# Patient Record
Sex: Male | Born: 1960 | Race: Black or African American | Hispanic: No | Marital: Single | Smoking: Former smoker
Health system: Southern US, Community
[De-identification: ages and names within clinical notes are randomized; demographics above are authoritative.]

## PROBLEM LIST (undated history)

## (undated) DIAGNOSIS — I10 Essential (primary) hypertension: Secondary | ICD-10-CM

## (undated) DIAGNOSIS — E119 Type 2 diabetes mellitus without complications: Secondary | ICD-10-CM

## (undated) HISTORY — PX: OTHER SURGICAL HISTORY: SHX169

---

## 2016-02-04 ENCOUNTER — Observation Stay: Payer: BLUE CROSS/BLUE SHIELD | Admitting: Anesthesiology

## 2016-02-04 ENCOUNTER — Encounter
Admission: EM | Disposition: A | Discharge status: Home / Self Care | Payer: Self-pay | Source: Home / Self Care | Attending: Emergency Medicine

## 2016-02-04 ENCOUNTER — Observation Stay
Admission: EM | Admit: 2016-02-04 | Discharge: 2016-02-05 | Disposition: A | Discharge status: Home / Self Care | Payer: BLUE CROSS/BLUE SHIELD | Attending: Internal Medicine | Admitting: Internal Medicine

## 2016-02-04 ENCOUNTER — Encounter: Payer: Self-pay | Admitting: Emergency Medicine

## 2016-02-04 DIAGNOSIS — D62 Acute posthemorrhagic anemia: Secondary | ICD-10-CM | POA: Diagnosis not present

## 2016-02-04 DIAGNOSIS — K254 Chronic or unspecified gastric ulcer with hemorrhage: Secondary | ICD-10-CM | POA: Diagnosis not present

## 2016-02-04 DIAGNOSIS — I10 Essential (primary) hypertension: Secondary | ICD-10-CM | POA: Diagnosis not present

## 2016-02-04 DIAGNOSIS — F17210 Nicotine dependence, cigarettes, uncomplicated: Secondary | ICD-10-CM | POA: Insufficient documentation

## 2016-02-04 DIAGNOSIS — K921 Melena: Secondary | ICD-10-CM | POA: Diagnosis not present

## 2016-02-04 DIAGNOSIS — K922 Gastrointestinal hemorrhage, unspecified: Secondary | ICD-10-CM | POA: Diagnosis present

## 2016-02-04 DIAGNOSIS — K2960 Other gastritis without bleeding: Secondary | ICD-10-CM | POA: Diagnosis not present

## 2016-02-04 DIAGNOSIS — R42 Dizziness and giddiness: Secondary | ICD-10-CM | POA: Diagnosis present

## 2016-02-04 DIAGNOSIS — R1084 Generalized abdominal pain: Secondary | ICD-10-CM | POA: Insufficient documentation

## 2016-02-04 DIAGNOSIS — Z7982 Long term (current) use of aspirin: Secondary | ICD-10-CM | POA: Insufficient documentation

## 2016-02-04 DIAGNOSIS — T39395A Adverse effect of other nonsteroidal anti-inflammatory drugs [NSAID], initial encounter: Secondary | ICD-10-CM | POA: Diagnosis not present

## 2016-02-04 DIAGNOSIS — B9681 Helicobacter pylori [H. pylori] as the cause of diseases classified elsewhere: Secondary | ICD-10-CM | POA: Diagnosis not present

## 2016-02-04 DIAGNOSIS — E119 Type 2 diabetes mellitus without complications: Secondary | ICD-10-CM | POA: Diagnosis not present

## 2016-02-04 HISTORY — PX: ESOPHAGOGASTRODUODENOSCOPY: SHX5428

## 2016-02-04 HISTORY — DX: Type 2 diabetes mellitus without complications: E11.9

## 2016-02-04 HISTORY — DX: Essential (primary) hypertension: I10

## 2016-02-04 LAB — COMPREHENSIVE METABOLIC PANEL
ALBUMIN: 3.7 g/dL (ref 3.5–5.0)
ALK PHOS: 48 U/L (ref 38–126)
ALT: 65 U/L — ABNORMAL HIGH (ref 17–63)
AST: 46 U/L — AB (ref 15–41)
Anion gap: 6 (ref 5–15)
BILIRUBIN TOTAL: 0.2 mg/dL — AB (ref 0.3–1.2)
BUN: 28 mg/dL — AB (ref 6–20)
CALCIUM: 8.7 mg/dL — AB (ref 8.9–10.3)
CO2: 23 mmol/L (ref 22–32)
Chloride: 106 mmol/L (ref 101–111)
Creatinine, Ser: 0.81 mg/dL (ref 0.61–1.24)
GFR calc Af Amer: >60 mL/min (ref >60)
GFR calc non Af Amer: >60 mL/min (ref >60)
GLUCOSE: 103 mg/dL — AB (ref 65–99)
Potassium: 3.8 mmol/L (ref 3.5–5.1)
Sodium: 135 mmol/L (ref 135–145)
TOTAL PROTEIN: 6.7 g/dL (ref 6.5–8.1)

## 2016-02-04 LAB — CBC
HEMATOCRIT: 33 % — AB (ref 40.0–52.0)
Hemoglobin: 11.5 g/dL — ABNORMAL LOW (ref 13.0–18.0)
MCH: 31.6 pg (ref 26.0–34.0)
MCHC: 34.7 g/dL (ref 32.0–36.0)
MCV: 91 fL (ref 80.0–100.0)
Platelets: 299 10*3/uL (ref 150–440)
RBC: 3.62 MIL/uL — ABNORMAL LOW (ref 4.40–5.90)
RDW: 13.1 % (ref 11.5–14.5)
WBC: 6.5 10*3/uL (ref 3.8–10.6)

## 2016-02-04 LAB — TYPE AND SCREEN
ABO/RH(D): O POS
ANTIBODY SCREEN: NEGATIVE

## 2016-02-04 LAB — TROPONIN I: Troponin I: <0.03 ng/mL (ref <0.03)

## 2016-02-04 LAB — HEMOGLOBIN: Hemoglobin: 10.9 g/dL — ABNORMAL LOW (ref 13.0–18.0)

## 2016-02-04 SURGERY — EGD (ESOPHAGOGASTRODUODENOSCOPY)
Anesthesia: General

## 2016-02-04 MED ORDER — VITAMIN B-12 1000 MCG PO TABS: 1000.0000 ug | ORAL_TABLET | Freq: Every day | ORAL | Status: DC

## 2016-02-04 MED ORDER — SODIUM CHLORIDE 0.9 % IV SOLN
8.0000 mg/h | INTRAVENOUS | Status: DC
  Administered 2016-02-04 – 2016-02-05 (×2): 8 mg/h via INTRAVENOUS
  Filled 2016-02-04 (×3): qty 80

## 2016-02-04 MED ORDER — LIDOCAINE HCL (CARDIAC) 20 MG/ML IV SOLN
INTRAVENOUS | Status: DC | PRN
  Administered 2016-02-04: 30 mg via INTRAVENOUS

## 2016-02-04 MED ORDER — SODIUM CHLORIDE 0.9 % IV SOLN
80.0000 mg | Freq: Once | INTRAVENOUS | Status: AC
  Administered 2016-02-04: 80 mg via INTRAVENOUS
  Filled 2016-02-04: qty 80

## 2016-02-04 MED ORDER — PROPOFOL 10 MG/ML IV BOLUS
INTRAVENOUS | Status: DC | PRN
  Administered 2016-02-04: 20 mg via INTRAVENOUS
  Administered 2016-02-04: 30 mg via INTRAVENOUS

## 2016-02-04 MED ORDER — ACETAMINOPHEN 325 MG PO TABS: 650.0000 mg | ORAL_TABLET | Freq: Four times a day (QID) | ORAL | Status: DC | PRN

## 2016-02-04 MED ORDER — PROPOFOL 500 MG/50ML IV EMUL
INTRAVENOUS | Status: DC | PRN
  Administered 2016-02-04: 140 ug/kg/min via INTRAVENOUS

## 2016-02-04 MED ORDER — ACETAMINOPHEN 650 MG RE SUPP: 650.0000 mg | Freq: Four times a day (QID) | RECTAL | Status: DC | PRN

## 2016-02-04 MED ORDER — PANTOPRAZOLE SODIUM 40 MG IV SOLR: 40.0000 mg | Freq: Two times a day (BID) | INTRAVENOUS | Status: DC

## 2016-02-04 MED ORDER — SODIUM CHLORIDE 0.9 % IV SOLN
INTRAVENOUS | Status: DC
  Administered 2016-02-04: 1000 mL via INTRAVENOUS

## 2016-02-04 NOTE — Anesthesia Postprocedure Evaluation (Signed)
Anesthesia Post Note  Patient: Kenneth Carney  Procedure(s) Performed: Procedure(s) (LRB): ESOPHAGOGASTRODUODENOSCOPY (EGD) (N/A)  Patient location during evaluation: Endoscopy Anesthesia Type: General Level of consciousness: awake and alert Pain management: pain level controlled Vital Signs Assessment: post-procedure vital signs reviewed and stable Respiratory status: spontaneous breathing and respiratory function stable Cardiovascular status: stable Anesthetic complications: no    Last Vitals:  Vitals:   02/04/16 1715 02/04/16 1718  BP: 135/81   Pulse: 88   Resp: (!) 26 15  Temp: 36.4 C (!) 35.9 C    Last Pain:  Vitals:   02/04/16 1718  TempSrc: Tympanic  PainSc:                  [name] K

## 2016-02-04 NOTE — Op Note (Signed)
[facility] Gastroenterology Patient Name: Kenneth Carney Procedure Date: 02/04/2016 4:52 PM MRN: [id number] Account #: [id number] Date of Birth: 1960/12/10 Admit Type: Inpatient Age: 55 [facility] 3 Gender: Male Note Status: Finalized Procedure:            Upper GI endoscopy Indications:          Generalized abdominal pain, Melena Providers:            Ula Lingoichard Missi Mcmackin, MD Referring MD:         No Local Md, MD (Referring MD) Medicines:            Monitored Anesthesia Care Complications:        No immediate complications. Procedure:            Pre-Anesthesia Assessment:                       - Prior to the procedure, a History and Physical was                        performed, and patient medications and allergies were                        reviewed. The risks and benefits of the procedure and                        the sedation options and risks were discussed with the                        patient. All questions were answered and informed                        consent was obtained. Patient identification and                        proposed procedure were verified. Mental Status                        Examination: alert and oriented. Airway Examination:                        normal oropharyngeal airway and neck mobility.                        Respiratory Examination: clear to auscultation. CV                        Examination: regular rate and rhythm. ASA Grade                        Assessment: III - A patient with severe systemic                        disease. After reviewing the risks and benefits, the                        patient was deemed in satisfactory condition to undergo                        the procedure. The anesthesia plan was to use monitored  anesthesia care (MAC). Immediately prior to                        administration of medications, the patient was                        re-assessed for adequacy to receive  sedatives. The                        heart rate, respiratory rate, oxygen saturations, blood                        pressure, adequacy of pulmonary ventilation, and                        response to care were monitored throughout the                        procedure. The physical status of the patient was                        re-assessed after the procedure.                       After obtaining informed consent, the endoscope was                        passed under direct vision. Throughout the procedure,                        the patient's blood pressure, pulse, and oxygen                        saturations were monitored continuously. The Endoscope                        was introduced through the mouth, and advanced to the                        second part of duodenum. The upper GI endoscopy was                        accomplished without difficulty. The patient tolerated                        the procedure well. Findings:      Multiple non-bleeding erosions were found in the gastric antrum. There       were no stigmata of recent bleeding. Biopsies were taken with a cold       forceps for Helicobacter pylori testing.      Diffuse moderate inflammation was found in the gastric body. Biopsies       were taken with a cold forceps for Helicobacter pylori testing. Impression:           - Gastric erosions without bleeding. Biopsied.                       - Gastritis. Biopsied. Recommendation:       - Await pathology results.                       -  Use a proton pump inhibitor PO.                       - Return patient to hospital ward for ongoing care. Procedure Code(s):    --- Professional ---                       [phone], Esophagogastroduodenoscopy, flexible, transoral;                        with biopsy, single or multiple Diagnosis Code(s):    --- Professional ---                       K25.9, Gastric ulcer, unspecified as acute or chronic,                        without  hemorrhage or perforation                       K29.70, Gastritis, unspecified, without bleeding                       R10.84, Generalized abdominal pain                       K92.1, Melena (includes Hematochezia) CPT copyright 2016 American Medical Association. All rights reserved. The codes documented in this report are preliminary and upon coder review may  be revised to meet current compliance requirements. Ula Lingo, MD Ula Lingo, MD 02/04/2016 5:17:08 PM This report has been signed electronically. Number of Addenda: 0 Note Initiated On: 02/04/2016 4:52 PM Estimated Blood Loss: Estimated blood loss was minimal.      [facility]

## 2016-02-04 NOTE — ED Triage Notes (Signed)
C/O black stools, onset yesterday.  This morning, c/o breathing heavy when walking in the house and feeling dizzy at work today.

## 2016-02-04 NOTE — ED Provider Notes (Signed)
[facility] Emergency Department Provider Note  ____________________________________________   I have reviewed the triage vital signs and the nursing notes.   HISTORY  Chief Complaint Dizziness    HPI Kenneth Carney is a 55 y.o. male who is not on any blood thinners, he states that he has been having black stool for the last 24 hours almost. Began last night. He is at 3 or 4 tarry stools which seem large to him. He started to feel lightheaded and when he walks he has started feels a little bit short of breath. Patient has no prior history of GI bleed, he denies any fever or chills. He has no abdominal pain.      Past Medical History:  Diagnosis Date  . Diabetes mellitus without complication (HCC)   . Hypertension     There are no active problems to display for this patient.   History reviewed. No pertinent surgical history.  Prior to Admission medications   Not on File    Allergies Review of patient's allergies indicates no known allergies.  No family history on file.  Social History Social History  Substance Use Topics  . Smoking status: Former Games developermoker  . Smokeless tobacco: Never Used  . Alcohol use No    Review of Systems Constitutional: No fever/chills Eyes: No visual changes. ENT: No sore throat. No stiff neck no neck pain Cardiovascular: Denies chest pain. Respiratory: See history of present illness Gastrointestinal:   no vomiting.  No diarrhea.  No constipation. Genitourinary: Negative for dysuria. Musculoskeletal: Negative lower extremity swelling Skin: Negative for rash. Neurological: Negative for severe headaches, focal weakness or numbness. 10-point ROS otherwise negative.  ____________________________________________   PHYSICAL EXAM:  VITAL SIGNS: ED Triage Vitals  Enc Vitals Group     BP 02/04/16 0946 126/81     Pulse Rate 02/04/16 0946 89     Resp 02/04/16 0946 18     Temp 02/04/16 0946 98 F (36.7 C)     Temp  src --      SpO2 02/04/16 0946 97 %     Weight 02/04/16 0948 280 lb (127 kg)     Height 02/04/16 0948 5\' 8"  (1.727 m)     Head Circumference --      Peak Flow --      Pain Score 02/04/16 0949 2     Pain Loc --      Pain Edu? --      Excl. in GC? --     Constitutional: Alert and oriented. Well appearing and in no acute distress. Eyes: Conjunctivae are normal. PERRL. EOMI. Head: Atraumatic. Nose: No congestion/rhinnorhea. Mouth/Throat: Mucous membranes are moist.  Oropharynx non-erythematous. Neck: No stridor.   Nontender with no meningismus Cardiovascular: Normal rate, regular rhythm. Grossly normal heart sounds.  Good peripheral circulation. Respiratory: Normal respiratory effort.  No retractions. Lungs CTAB. Abdominal: Soft and nontender. No distention. No guarding no rebound Back:  There is no focal tenderness or step off.  there is no midline tenderness there are no lesions noted. there is no CVA tenderness Rectal exam: Guaiac positive black stool Musculoskeletal: No lower extremity tenderness, no upper extremity tenderness. No joint effusions, no DVT signs strong distal pulses no edema Neurologic:  Normal speech and language. No gross focal neurologic deficits are appreciated.  Skin:  Skin is warm, dry and intact. No rash noted. Psychiatric: Mood and affect are normal. Speech and behavior are normal.  ____________________________________________   LABS (all labs ordered are listed, but  only abnormal results are displayed)  Labs Reviewed  COMPREHENSIVE METABOLIC PANEL - Abnormal; Notable for the following:       Result Value   Glucose, Bld 103 (*)    BUN 28 (*)    Calcium 8.7 (*)    AST 46 (*)    ALT 65 (*)    Total Bilirubin 0.2 (*)    All other components within normal limits  CBC - Abnormal; Notable for the following:    RBC 3.62 (*)    Hemoglobin 11.5 (*)    HCT 33.0 (*)    All other components within normal limits  TROPONIN I  POC OCCULT BLOOD, ED  TYPE AND  SCREEN   ____________________________________________  EKG  I personally interpreted any EKGs ordered by me or triage Normal sinus rhythm at 88 bpm no acute ST elevation or acute ST depression ____________________________________________  RADIOLOGY  I reviewed any imaging ordered by me or triage that were performed during my shift and, if possible, patient and/or family made aware of any abnormal findings. ____________________________________________   PROCEDURES  Procedure(s) performed: None  Procedures  Critical Care performed: None  ____________________________________________   INITIAL IMPRESSION / ASSESSMENT AND PLAN / ED COURSE  Pertinent labs & imaging results that were available during my care of the patient were reviewed by me and considered in my medical decision making (see chart for details).  Patient with what appears to be an upper GI bleed. We will give him 2 IVs, BUN is elevated also suggested upper GI bleed. Hemoglobin fortunately is reassuring at this time although I suspect this will trend down given his symptomatic complaints. Patient does have a type and screen now on file. There is no evidence of other acute pathology causing his symptoms. He does not have any pain I do not think imaging is indicated. We will place 2 IVs in the patient, he will require admission for observation. I'm starting her protonic strip.  Clinical Course   ____________________________________________   FINAL CLINICAL IMPRESSION(S) / ED DIAGNOSES  Final diagnoses:  None      This chart was dictated using voice recognition software.  Despite best efforts to proofread,  errors can occur which can change meaning.     Jeanmarie PlantJames A Cara Thaxton, MD 02/04/16 [phone]

## 2016-02-04 NOTE — Consult Note (Signed)
Consultation  Referring Provider:     No ref. provider found Primary Care Physician:  No PCP Per Patient Primary Gastroenterologist:  Dr. Nedra HaiLee        Reason for Consultation:     GI bleed with a presyncopal episode.  Date of Admission:  02/04/2016 Date of Consultation:  02/04/2016         HPI:   Kenneth Carney is a 55 y.o. male with a new onset of melena and a presyncopal episode . He has a H/O NSAID usage and did note mild abdominal discomfort over recent history. There is no evidence of active bleeding at this time.  Past Medical History:  Diagnosis Date  . Diabetes mellitus without complication (HCC)   . Hypertension     Past Surgical History:  Procedure Laterality Date  . lipoma removal     neck and shoulder    Prior to Admission medications   Medication Sig Start Date End Date Taking? Authorizing Provider  Aspirin-Acetaminophen-Caffeine (GOODY HEADACHE PO) Take 1 Package by mouth daily.   Yes Historical Provider, MD  vitamin B-12 (CYANOCOBALAMIN) 1000 MCG tablet Take 1,000 mcg by mouth daily.   Yes Historical Provider, MD    Family History  Problem Relation Age of Onset  . Kidney disease Mother   . Diabetes Mother   . Hypertension Mother   . Cancer Father      Social History  Substance Use Topics  . Smoking status: Current Every Day Smoker  . Smokeless tobacco: Never Used  . Alcohol use No    Allergies as of 02/04/2016  . (No Known Allergies)    Review of Systems:    All systems reviewed and negative except where noted in HPI.   Physical Exam:  Vital signs in last 24 hours: Temp:  [98 F (36.7 C)-98.4 F (36.9 C)] 98.4 F (36.9 C) (10/30 1440) Pulse Rate:  [73-89] 73 (10/30 1440) Resp:  [0-23] 23 (10/30 1400) BP: (122-161)/(79-101) 122/79 (10/30 1440) SpO2:  [97 %-100 %] 100 % (10/30 1440) Weight:  [280 lb (127 kg)] 280 lb (127 kg) (10/30 0948) Last BM Date: 02/04/16 General:   Pleasant, cooperative in NAD Head:  Normocephalic and  atraumatic. Eyes:   No icterus.   Conjunctiva pink. PERRLA. Ears:  Normal auditory acuity. Neck:  Supple; no masses or thyroidomegaly Lungs: Respirations even and unlabored. Lungs clear to auscultation bilaterally.   No wheezes, crackles, or rhonchi.  Heart:  Regular rate and rhythm;  Without murmur, clicks, rubs or gallops Abdomen:  Soft, nondistended, nontender. Normal bowel sounds. No appreciable masses or hepatomegaly.  No rebound or guarding.  Rectal:  Not performed. Msk:  Symmetrical without gross deformities.  Strength - normal   Extremities:  Without edema, cyanosis or clubbing. Neurologic:  Alert and oriented x3;  grossly normal neurologically. Skin:  Intact without significant lesions or rashes. Cervical Nodes:  No significant cervical adenopathy. Psych:  Alert and cooperative. Normal affect.  LAB RESULTS:  Recent Labs  02/04/16 0951  WBC 6.5  HGB 11.5*  HCT 33.0*  PLT 299   BMET  Recent Labs  02/04/16 0951  NA 135  K 3.8  CL 106  CO2 23  GLUCOSE 103*  BUN 28*  CREATININE 0.81  CALCIUM 8.7*   LFT  Recent Labs  02/04/16 0951  PROT 6.7  ALBUMIN 3.7  AST 46*  ALT 65*  ALKPHOS 48  BILITOT 0.2*   PT/INR No results for input(s): LABPROT, INR in the last 72  hours.  STUDIES: No results found.    Impression / Plan:   Kenneth Carney is a 55 y.o. y/o male with a GI bleed with melena and a presyncopal episode with a NSAID usage. / Plan current supportive care - EGD for diagnosis and possible therapy.   Thank you for involving me in the care of this patient.      LOS: 0 days   Ula Lingoichard Bobetta Korf, MD  02/04/2016, 3:59 PM   Note: This dictation was prepared with Dragon dictation along with smaller phrase technology. Any transcriptional errors that result from this process are unintentional.

## 2016-02-04 NOTE — ED Notes (Signed)
Pt reports black stool since yesterday, dizziness since yesterday; Pt states he was walking around at work and began to feel like he was going to pass out. Pt alert & oriented, concerned that his car is [place] a [place]ite and needs to be moved.

## 2016-02-04 NOTE — Transfer of Care (Addendum)
Immediate Anesthesia Transfer of Care Note  Patient: Kenneth Carney  Procedure(s) Performed: Procedure(s): ESOPHAGOGASTRODUODENOSCOPY (EGD) (N/A)  Patient Location: Endoscopy Unit  Anesthesia Type:General  Level of Consciousness: awake  Airway & Oxygen Therapy: Patient Spontanous Breathing  Post-op Assessment: Post -op Vital signs reviewed and stable  Post vital signs: stable  Last Vitals:  Vitals:   02/04/16 1715 02/04/16 1718  BP:    Pulse: 88   Resp: (!) 26 15  Temp: 36.4 C (!) 35.9 C    Last Pain:  Vitals:   02/04/16 1718  TempSrc: Tympanic  PainSc:          Complications: No apparent anesthesia complications

## 2016-02-04 NOTE — Anesthesia Preprocedure Evaluation (Signed)
Anesthesia Evaluation  Patient identified by MRN, date of birth, ID band Patient awake    Reviewed: Allergy & Precautions, NPO status , Patient's Chart, lab work & pertinent test results  History of Anesthesia Complications Negative for: history of anesthetic complications  Airway Mallampati: III       Dental   Pulmonary Current Smoker,           Cardiovascular hypertension,      Neuro/Psych negative neurological ROS     GI/Hepatic negative GI ROS, Neg liver ROS,   Endo/Other  diabetes  Renal/GU negative Renal ROS     Musculoskeletal   Abdominal   Peds  Hematology  (+) anemia ,   Anesthesia Other Findings   Reproductive/Obstetrics                             Anesthesia Physical Anesthesia Plan  ASA: III and emergent  Anesthesia Plan: General   Post-op Pain Management:    Induction: Intravenous  Airway Management Planned: Nasal Cannula  Additional Equipment:   Intra-op Plan:   Post-operative Plan:   Informed Consent: I have reviewed the patients History and Physical, chart, labs and discussed the procedure including the risks, benefits and alternatives for the proposed anesthesia with the patient or authorized representative who has indicated his/her understanding and acceptance.     Plan Discussed with:   Anesthesia Plan Comments:         Anesthesia Quick Evaluation

## 2016-02-04 NOTE — Anesthesia Procedure Notes (Signed)
Date/Time: 02/04/2016 4:56 PM Performed by: Ginger CarneMICHELET, Xhaiden Coombs Pre-anesthesia Checklist: Patient identified, Emergency Drugs available, Suction available, Patient being monitored and Timeout performed Patient Re-evaluated:Patient Re-evaluated prior to inductionOxygen Delivery Method: Nasal cannula

## 2016-02-04 NOTE — H&P (Signed)
[facility] [place] [facility]   PATIENT NAME: Kenneth Carney    MR#:  [id number]  DATE OF BIRTH:  Oct 14, 1960  DATE OF ADMISSION:  02/04/2016  PRIMARY CARE PHYSICIAN: No PCP Per Patient   REQUESTING/REFERRING PHYSICIAN: Dr Ileana RoupJames McShane  CHIEF COMPLAINT:   Chief Complaint  Patient presents with  . Dizziness    HISTORY OF PRESENT ILLNESS:  Kenneth Carney  is a 55 y.o. male presents with 4 episodes of black stools. 3 last night and one this morning. 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 His stomach has been hurting he describes it as a flipping feeling. He's been very tired lately. His stomach has been growling for weeks. He has been taking BC powder over the last few weeks.  PAST MEDICAL HISTORY:   Past Medical History:  Diagnosis Date  . Diabetes mellitus without complication (HCC)   . Hypertension     PAST SURGICAL HISTORY:   Past Surgical History:  Procedure Laterality Date  . lipoma removal     neck and shoulder    SOCIAL HISTORY:   Social History  Substance Use Topics  . Smoking status: Current Every Day Smoker  . Smokeless tobacco: Never Used  . Alcohol use No    FAMILY HISTORY:   Family History  Problem Relation Age of Onset  . Kidney disease Mother   . Diabetes Mother   . Hypertension Mother   . Cancer Father     DRUG ALLERGIES:  No Known Allergies  REVIEW OF SYSTEMS:  CONSTITUTIONAL: No fever. Hot cold feeling in his stomach. Positive for weight loss. Positive for fatigue. EYES: Blurred vision with reading EARS, NOSE, AND THROAT: No tinnitus or ear pain. No sore throat. Decreased hearing. RESPIRATORY: Some cough and shortness of breath. No wheezing or hemoptysis.  CARDIOVASCULAR: Occasional chest pain. No orthopnea, edema.  GASTROINTESTINAL: No nausea, vomiting. Positive upper abdominal pain. Positive for black stools. GENITOURINARY: No dysuria,  hematuria.  ENDOCRINE: No polyuria, nocturia,  HEMATOLOGY: No anemia, easy bruising or bleeding SKIN: No rash or lesion. MUSCULOSKELETAL: Positive for arthritis.   NEUROLOGIC: No tingling, numbness, weakness.  PSYCHIATRY: No anxiety or depression.   MEDICATIONS AT HOME:   Prior to Admission medications   Medication Sig Start Date End Date Taking? Authorizing Provider  Aspirin-Acetaminophen-Caffeine (GOODY HEADACHE PO) Take 1 Package by mouth daily.   Yes Historical Provider, MD  vitamin B-12 (CYANOCOBALAMIN) 1000 MCG tablet Take 1,000 mcg by mouth daily.   Yes Historical Provider, MD      VITAL SIGNS:  Blood pressure 126/81, pulse 89, temperature 98 F (36.7 C), resp. rate 18, height 5\' 8"  (1.727 m), weight 127 kg (280 lb), SpO2 97 %.  PHYSICAL EXAMINATION:  GENERAL:  55 y.o.-year-old patient lying in the bed with no acute distress.  EYES: Pupils equal, round, reactive to light and accommodation. No scleral icterus. Extraocular muscles intact.  HEENT: Head atraumatic, normocephalic. Oropharynx and nasopharynx clear.  NECK:  Supple, no jugular venous distention. No thyroid enlargement, no tenderness.  LUNGS: Normal breath sounds bilaterally, no wheezing, rales,rhonchi or crepitation. No use of accessory muscles of respiration.  CARDIOVASCULAR: S1, S2 normal. No murmurs, rubs, or gallops.  ABDOMEN: Soft, Slight epigastric pain, nondistended. Bowel sounds present. No organomegaly or mass.  EXTREMITIES: No pedal edema, cyanosis, or clubbing.  NEUROLOGIC: Cranial nerves II through XII are intact. Muscle strength 5/5 in all extremities.  Sensation intact. Gait not checked.  PSYCHIATRIC: The patient is alert and oriented x 3.  SKIN: No rash, lesion, or ulcer.   LABORATORY PANEL:   CBC  Recent Labs Lab 02/04/16 0951  WBC 6.5  HGB 11.5*  HCT 33.0*  PLT 299    ------------------------------------------------------------------------------------------------------------------  Chemistries   Recent Labs Lab 02/04/16 0951  NA 135  K 3.8  CL 106  CO2 23  GLUCOSE 103*  BUN 28*  CREATININE 0.81  CALCIUM 8.7*  AST 46*  ALT 65*  ALKPHOS 48  BILITOT 0.2*   ------------------------------------------------------------------------------------------------------------------  Cardiac Enzymes  Recent Labs Lab 02/04/16 0951  TROPONINI <0.03   ------------------------------------------------------------------------------------------------------------------   EKG:   Normal sinus rhythm 80 bpm  IMPRESSION AND PLAN:   1. Melena and upper GI bleed secondary to [facility]. ER physician started the patient on a Protonix drip. I'm still trying to reach gastroenterology about timing for endoscopy. I advised the patient he must stop all anti-inflammatories including the BC powder. Serial hemoglobins. Transfuse only if necessary. 2. Type 2 diabetes mellitus controlled by diet 3. Elevated liver function tests rechecked tomorrow 4. Tobacco abuse smoking physician counseling done 3 minutes by me. No need for nicotine patch since he only smokes 3 cigarettes a day.   All the records are reviewed and case discussed with ED provider. Management plans discussed with the patient, family and they are in agreement.  CODE STATUS: Full Code  TOTAL TIME TAKING CARE OF THIS PATIENT: 50 minutes.    Alford HighlandWIETING, Lerline Valdivia M.D on 02/04/2016 at 12:52 PM  Between 7am to 6pm - Pager - [phone]  After 6pm call admission pager [phone]  [facility] Office  [phone]  CC: Primary care physician; No PCP Per Patient

## 2016-02-05 ENCOUNTER — Encounter: Payer: Self-pay | Admitting: Internal Medicine

## 2016-02-05 DIAGNOSIS — T39395A Adverse effect of other nonsteroidal anti-inflammatory drugs [NSAID], initial encounter: Secondary | ICD-10-CM

## 2016-02-05 DIAGNOSIS — D62 Acute posthemorrhagic anemia: Secondary | ICD-10-CM | POA: Diagnosis present

## 2016-02-05 DIAGNOSIS — K2960 Other gastritis without bleeding: Secondary | ICD-10-CM | POA: Diagnosis present

## 2016-02-05 LAB — CBC
HCT: 30 % — ABNORMAL LOW (ref 40.0–52.0)
Hemoglobin: 10.4 g/dL — ABNORMAL LOW (ref 13.0–18.0)
MCH: 31.3 pg (ref 26.0–34.0)
MCHC: 34.7 g/dL (ref 32.0–36.0)
MCV: 90.3 fL (ref 80.0–100.0)
PLATELETS: 255 10*3/uL (ref 150–440)
RBC: 3.32 MIL/uL — AB (ref 4.40–5.90)
RDW: 13.2 % (ref 11.5–14.5)
WBC: 6.1 10*3/uL (ref 3.8–10.6)

## 2016-02-05 LAB — BASIC METABOLIC PANEL
Anion gap: 4 — ABNORMAL LOW (ref 5–15)
BUN: 19 mg/dL (ref 6–20)
CALCIUM: 8.6 mg/dL — AB (ref 8.9–10.3)
CHLORIDE: 110 mmol/L (ref 101–111)
CO2: 25 mmol/L (ref 22–32)
CREATININE: 0.74 mg/dL (ref 0.61–1.24)
GFR calc non Af Amer: >60 mL/min (ref >60)
GLUCOSE: 84 mg/dL (ref 65–99)
Potassium: 3.6 mmol/L (ref 3.5–5.1)
Sodium: 139 mmol/L (ref 135–145)

## 2016-02-05 LAB — HEMOGLOBIN: HEMOGLOBIN: 10.5 g/dL — AB (ref 13.0–18.0)

## 2016-02-05 MED ORDER — PANTOPRAZOLE SODIUM 40 MG PO TBEC: 40.0000 mg | DELAYED_RELEASE_TABLET | Freq: Every day | ORAL | 0 refills | Status: DC

## 2016-02-05 MED ORDER — FERROUS SULFATE 325 (65 FE) MG PO TABS: 325.0000 mg | ORAL_TABLET | Freq: Every day | ORAL | 0 refills | Status: DC

## 2016-02-05 NOTE — Progress Notes (Signed)
[facility]         [place]   02/05/2016  Patient: Kenneth Carney Newborn   Date of Birth:  [birth year]  Date of admission:  02/04/2016  Date of Discharge  02/05/2016    To Whom it May Concern:   Kenneth Carney [place]  may return to work on 02/11/2016.  PHYSICAL ACTIVITY:  Full from 02/11/2016  If you have any questions or concerns, please don't hesitate to call.  Sincerely,   Milagros LollSudini, Shalia Bartko R M.D Office : [phone]   .

## 2016-02-05 NOTE — Discharge Instructions (Signed)
Do not use Ibuprofen, Aspirin, Naproxen, NSAIDs  Soft bland diet for 3 days and then advance  No alcohol or smoking  Activity as tolerated

## 2016-02-05 NOTE — Progress Notes (Signed)
DISCHARGE NOTE:  Pt given discharge note, prescriptions and work note. Pt verbalized understanding.

## 2016-02-06 LAB — SURGICAL PATHOLOGY

## 2016-02-07 NOTE — Discharge Summary (Signed)
[facility] [place] [facility]   PATIENT NAME: Kenneth Carney    MR#:  [id number]  DATE OF BIRTH:  1960-04-09  DATE OF ADMISSION:  02/04/2016 ADMITTING PHYSICIAN: Alford Highlandichard Wieting, MD  DATE OF DISCHARGE: 02/05/2016 12:00 PM  PRIMARY CARE PHYSICIAN: No PCP Per Patient   ADMISSION DIAGNOSIS:  Lower GI bleed [K92.2]  DISCHARGE DIAGNOSIS:  Active Problems:   Upper GI bleed   Gastritis due to nonsteroidal anti-inflammatory drug (NSAID)   Acute blood loss anemia   SECONDARY DIAGNOSIS:   Past Medical History:  Diagnosis Date  . Diabetes mellitus without complication (HCC)   . Hypertension      ADMITTING HISTORY  HISTORY OF PRESENT ILLNESS:  Kenneth Carney  is a 55 y.o. male presents with 4 episodes of black stools. 3 last night and one this morning. 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 His stomach has been hurting he describes it as a flipping feeling. He's been very tired lately. His stomach has been growling for weeks. He has been taking BC powder over the last few weeks.   HOSPITAL COURSE:   * erosive gastritis Due to [facility]. Patient presented with dizziness and melena and was found to have anemia. Acute blood loss anemia remained stable during the hospital stay. EGD showed erosive gastritis. Started on PPIs. Counseled to not take any NSAIDs or BC powders in the future. Patient has no pain. Dizziness has resolved with fluids. He is being continued on soft bland diet for 3 days and then advance as tolerated. Able for discharge home to follow-up with his primary care physician. Patient's biopsy results and H. pylori are pending. He will follow-up with GI as outpatient.  CONSULTS OBTAINED:    DRUG ALLERGIES:  No Known Allergies  DISCHARGE MEDICATIONS:   Discharge Medication List as of 02/05/2016 10:36 AM    START taking these medications   Details  ferrous sulfate 325 (65 FE) MG  tablet Take 1 tablet (325 mg total) by mouth daily with breakfast., Starting Tue 02/05/2016, Print    pantoprazole (PROTONIX) 40 MG tablet Take 1 tablet (40 mg total) by mouth daily., Starting Tue 02/05/2016, Normal      CONTINUE these medications which have NOT CHANGED   Details  vitamin B-12 (CYANOCOBALAMIN) 1000 MCG tablet Take 1,000 mcg by mouth daily., Historical Med      STOP taking these medications     Aspirin-Acetaminophen-Caffeine (GOODY HEADACHE PO)         Today   VITAL SIGNS:  Blood pressure 124/68, pulse 72, temperature 98.5 F (36.9 C), temperature source Oral, resp. rate 18, height 5\' 8"  (1.727 m), weight 127 kg (280 lb), SpO2 99 %.  I/O:  No intake or output data in the 24 hours ending 02/07/16 1625  PHYSICAL EXAMINATION:  Physical Exam  GENERAL:  55 y.o.-year-old patient lying in the bed with no acute distress.  LUNGS: Normal breath sounds bilaterally, no wheezing, rales,rhonchi or crepitation. No use of accessory muscles of respiration.  CARDIOVASCULAR: S1, S2 normal. No murmurs, rubs, or gallops.  ABDOMEN: Soft, non-tender, non-distended. Bowel sounds present. No organomegaly or mass.  NEUROLOGIC: Moves all 4 extremities. PSYCHIATRIC: The patient is alert and oriented x 3.  SKIN: No obvious rash, lesion, or ulcer.   DATA REVIEW:   CBC  Recent Labs Lab 02/05/16 0035  WBC 6.1  HGB 10.4*  10.5*  HCT 30.0*  PLT 255    Chemistries   Recent Labs Lab 02/04/16 0951 02/05/16 0035  NA 135 139  K 3.8 3.6  CL 106 110  CO2 23 25  GLUCOSE 103* 84  BUN 28* 19  CREATININE 0.81 0.74  CALCIUM 8.7* 8.6*  AST 46*  --   ALT 65*  --   ALKPHOS 48  --   BILITOT 0.2*  --     Cardiac Enzymes  Recent Labs Lab 02/04/16 0951  TROPONINI <0.03    Microbiology Results  No results found for this or any previous visit.  RADIOLOGY:  No results found.  Follow up with PCP in 1 week.  Management plans discussed with the patient, family and they are  in agreement.  CODE STATUS:  Code Status History    Date Active Date Inactive Code Status Order ID Comments User Context   02/04/2016  1:01 PM 02/05/2016  5:38 PM Full Code [id number]  Alford Highlandichard Wieting, MD ED      TOTAL TIME TAKING CARE OF THIS PATIENT ON DAY OF DISCHARGE: more than 30 minutes.   Milagros LollSudini, Grey Schlauch R M.D on 02/07/2016 at 4:25 PM  Between 7am to 6pm - Pager - [phone]  After 6pm go to [email or web address] - password [email or web address] [facility]  [facility]  Office  [phone]  CC: Primary care physician; No PCP Per Patient  Note: This dictation was prepared with Dragon dictation along with smaller phrase technology. Any transcriptional errors that result from this process are unintentional.

## 2017-03-03 ENCOUNTER — Ambulatory Visit
Admission: RE | Admit: 2017-03-03 | Discharge: 2017-03-03 | Disposition: A | Discharge status: Home / Self Care | Payer: Worker's Compensation | Source: Ambulatory Visit | Attending: Physician Assistant | Admitting: Physician Assistant

## 2017-03-03 ENCOUNTER — Other Ambulatory Visit: Payer: Self-pay | Admitting: Physician Assistant

## 2017-03-03 DIAGNOSIS — M79644 Pain in right finger(s): Secondary | ICD-10-CM

## 2017-03-03 DIAGNOSIS — S62666A Nondisplaced fracture of distal phalanx of right little finger, initial encounter for closed fracture: Secondary | ICD-10-CM | POA: Insufficient documentation

## 2017-03-03 DIAGNOSIS — X58XXXA Exposure to other specified factors, initial encounter: Secondary | ICD-10-CM | POA: Diagnosis not present

## 2018-07-21 ENCOUNTER — Other Ambulatory Visit: Payer: Self-pay

## 2018-07-21 ENCOUNTER — Ambulatory Visit
Admission: EM | Admit: 2018-07-21 | Discharge: 2018-07-21 | Disposition: A | Discharge status: Home / Self Care | Payer: BLUE CROSS/BLUE SHIELD | Attending: Family Medicine | Admitting: Family Medicine

## 2018-07-21 ENCOUNTER — Encounter: Payer: Self-pay | Admitting: Emergency Medicine

## 2018-07-21 DIAGNOSIS — M545 Low back pain, unspecified: Secondary | ICD-10-CM

## 2018-07-21 DIAGNOSIS — R1032 Left lower quadrant pain: Secondary | ICD-10-CM | POA: Diagnosis not present

## 2018-07-21 LAB — COMPREHENSIVE METABOLIC PANEL
ALT: 52 U/L — ABNORMAL HIGH (ref 0–44)
AST: 38 U/L (ref 15–41)
Albumin: 4.4 g/dL (ref 3.5–5.0)
Alkaline Phosphatase: 65 U/L (ref 38–126)
Anion gap: 6 (ref 5–15)
BUN: 15 mg/dL (ref 6–20)
CO2: 25 mmol/L (ref 22–32)
Calcium: 9.1 mg/dL (ref 8.9–10.3)
Chloride: 105 mmol/L (ref 98–111)
Creatinine, Ser: 0.77 mg/dL (ref 0.61–1.24)
GFR calc Af Amer: >60 mL/min (ref >60)
GFR calc non Af Amer: >60 mL/min (ref >60)
Glucose, Bld: 100 mg/dL — ABNORMAL HIGH (ref 70–99)
Potassium: 3.7 mmol/L (ref 3.5–5.1)
Sodium: 136 mmol/L (ref 135–145)
Total Bilirubin: 1.1 mg/dL (ref 0.3–1.2)
Total Protein: 8.3 g/dL — ABNORMAL HIGH (ref 6.5–8.1)

## 2018-07-21 LAB — URINALYSIS, COMPLETE (UACMP) WITH MICROSCOPIC
Bacteria, UA: NONE SEEN
Bilirubin Urine: NEGATIVE
Glucose, UA: NEGATIVE mg/dL
Hgb urine dipstick: NEGATIVE
Ketones, ur: NEGATIVE mg/dL
Leukocytes,Ua: NEGATIVE
Nitrite: NEGATIVE
Specific Gravity, Urine: 1.025 (ref 1.005–1.030)
WBC, UA: NONE SEEN WBC/hpf (ref 0–5)
pH: 5 (ref 5.0–8.0)

## 2018-07-21 LAB — HEMOGLOBIN A1C
Hgb A1c MFr Bld: 5.5 % (ref 4.8–5.6)
Mean Plasma Glucose: 111.15 mg/dL

## 2018-07-21 MED ORDER — TAMSULOSIN HCL 0.4 MG PO CAPS: 0.4000 mg | ORAL_CAPSULE | Freq: Every day | ORAL | 0 refills | Status: DC

## 2018-07-21 MED ORDER — HYDROCODONE-ACETAMINOPHEN 5-325 MG PO TABS: 1.0000 | ORAL_TABLET | Freq: Three times a day (TID) | ORAL | 0 refills | Status: DC | PRN

## 2018-07-21 MED ORDER — TIZANIDINE HCL 4 MG PO TABS: 4.0000 mg | ORAL_TABLET | Freq: Three times a day (TID) | ORAL | 0 refills | Status: DC | PRN

## 2018-07-21 NOTE — ED Provider Notes (Signed)
[facility]    CSN: [id number] Arrival date & time: 07/21/18  4098  History   Chief Complaint Chief Complaint  Patient presents with  . Back Pain  . Groin Pain   HPI  58 year old male presents with the above complaints.  Patient reports he developed left low back pain on Sunday.  Has steadily worsened.  States that his pain is currently 10/10 in severity.  He reports left low back pain with radiation to his groin and left testicle.  Patient states that he is unable to get comfortable.  Describes the pain as sharp.  He has been using ice and heat without resolution.  Patient states that his pain seems to be worse with movement.  No recent fall, trauma, injury.  No reports of hematuria.  No nausea, vomiting, diarrhea.  No known relieving factors.  No other associated symptoms.  No other complaints.  PMH, Surgical Hx, Family Hx, Social History reviewed and updated as below.  PMH: Hypertension, DM 2, history of GI bleed secondary to NSAID use, hepatitis C  Past Surgical History:  Procedure Laterality Date  . ESOPHAGOGASTRODUODENOSCOPY N/A 02/04/2016   Procedure: ESOPHAGOGASTRODUODENOSCOPY (EGD);  Surgeon: Ula Lingo, MD;  Location: [facility] ENDOSCOPY;  Service: Gastroenterology;  Laterality: N/A;  . lipoma removal     neck and shoulder    Home Medications    Prior to Admission medications   Medication Sig Start Date End Date Taking? Authorizing Provider  HYDROcodone-acetaminophen (NORCO/VICODIN) 5-325 MG tablet Take 1 tablet by mouth every 8 (eight) hours as needed for moderate pain or severe pain. 07/21/18   Tommie Sams, DO  tamsulosin (FLOMAX) 0.4 MG CAPS capsule Take 1 capsule (0.4 mg total) by mouth daily. 07/21/18   Tommie Sams, DO  tiZANidine (ZANAFLEX) 4 MG tablet Take 1 tablet (4 mg total) by mouth every 8 (eight) hours as needed for muscle spasms. 07/21/18   Tommie Sams, DO    Family History Family History  Problem Relation Age of Onset  . Kidney disease  Mother   . Diabetes Mother   . Hypertension Mother   . Cancer Father     Social History Social History   Tobacco Use  . Smoking status: Former Games developer  . Smokeless tobacco: Never Used  Substance Use Topics  . Alcohol use: No  . Drug use: No     Allergies   Patient has no known allergies.   Review of Systems Review of Systems  Gastrointestinal: Negative.   Genitourinary: Negative.        Groin pain, L testicle pain.  Musculoskeletal: Positive for back pain.   Physical Exam Triage Vital Signs ED Triage Vitals  Enc Vitals Group     BP 07/21/18 0838 (!) 148/86     Pulse Rate 07/21/18 0838 76     Resp 07/21/18 0838 18     Temp 07/21/18 0838 98.1 F (36.7 C)     Temp Source 07/21/18 0838 Oral     SpO2 07/21/18 0838 97 %     Weight 07/21/18 0836 280 lb (127 kg)     Height 07/21/18 0836  (1.727 m)     Head Circumference --      Peak Flow --      Pain Score 07/21/18 0835 10     Pain Loc --      Pain Edu? --      Excl. in GC? --    Updated Vital Signs BP (!) 148/86 (BP Location:  Right Arm)   Pulse 76   Temp 98.1 F (36.7 C) (Oral)   Resp 18   Ht 5\' 8"  (1.727 m)   Wt 127 kg   SpO2 97%   BMI 42.57 kg/m   Visual Acuity Right Eye Distance:   Left Eye Distance:   Bilateral Distance:    Right Eye Near:   Left Eye Near:    Bilateral Near:     Physical Exam Vitals signs and nursing note reviewed.  Constitutional:      Appearance: He is obese.     Comments: Appears uncomfortable.  HENT:     Head: Normocephalic and atraumatic.  Eyes:     General:        Right eye: No discharge.     Conjunctiva/sclera: Conjunctivae normal.  Cardiovascular:     Rate and Rhythm: Normal rate and regular rhythm.  Pulmonary:     Effort: Pulmonary effort is normal.     Breath sounds: Normal breath sounds.  Abdominal:     General: There is no distension.     Palpations: Abdomen is soft.     Comments: Mild LLQ tenderness to palpation.  Genitourinary:    Penis: Normal.       Scrotum/Testes: Normal.  Musculoskeletal:     Comments: Left lumbar spine mild tenderness to palpation.  Patient had considerable difficulty laying down on the exam table.  Neurological:     Mental Status: He is alert.  Psychiatric:        Mood and Affect: Mood normal.        Behavior: Behavior normal.    UC Treatments / Results  Labs (all labs ordered are listed, but only abnormal results are displayed) Labs Reviewed  URINALYSIS, COMPLETE (UACMP) WITH MICROSCOPIC - Abnormal; Notable for the following components:      Result Value   Protein, ur TRACE (*)    All other components within normal limits  COMPREHENSIVE METABOLIC PANEL  HEMOGLOBIN A1C   EKG None  Radiology No results found.  Procedures Procedures (including critical care time)  Medications Ordered in UC Medications - No data to display  Initial Impression / Assessment and Plan / UC Course  I have reviewed the triage vital signs and the nursing notes.  Pertinent labs & imaging results that were available during my care of the patient were reviewed by me and considered in my medical decision making (see chart for details).    58 year old male presents with back pain radiating to the groin and left testicle.  Concern for possible nephrolithiasis.  Urinalysis was unremarkable.  CT scan not available today.  Treating for both musculoskeletal etiology as well as possible kidney stone.  Treating with Vicodin, Flomax, Zanaflex.  Patient also desired to have his blood drawn routine labs and diabetes.  I obliged.  Awaiting CMP and A1c.  [place] controlled substance [place] reviewed today.  No concerns for abuse.  Final Clinical Impressions(s) / UC Diagnoses   Final diagnoses:  Acute left-sided low back pain without sciatica  Left inguinal pain     Discharge Instructions     Medications as directed for your pain.  If this persists, you will need a CT (I don't have one available at this time).   Strain your urine.  Take care  Dr. Adriana Simasook     ED Prescriptions    Medication Sig Dispense Auth. Provider   HYDROcodone-acetaminophen (NORCO/VICODIN) 5-325 MG tablet Take 1 tablet by mouth every 8 (eight) hours as needed for  moderate pain or severe pain. 10 tablet Casten Floren G, DO   tamsulosin (FLOMAX) 0.4 MG CAPS capsule Take 1 capsule (0.4 mg total) by mouth daily. 14 capsule Ramie Palladino G, DO   tiZANidine (ZANAFLEX) 4 MG tablet Take 1 tablet (4 mg total) by mouth every 8 (eight) hours as needed for muscle spasms. 30 tablet Tommie Sams, DO     Controlled Substance Prescriptions [place] Controlled Substance Registry consulted? Yes, I have consulted the [place] Controlled Substances Registry for this patient, and feel the risk/benefit ratio today is favorable for proceeding with this prescription for a controlled substance.   Everlene Other [place], [place] 07/21/18 [phone]

## 2018-07-21 NOTE — ED Triage Notes (Signed)
Patient c/o left side low back pain and left groin pain that started Sunday. Patient reports the pain has increased. He has been using heat and ice to his back with no relief.

## 2018-07-21 NOTE — Discharge Instructions (Signed)
Medications as directed for your pain.  If this persists, you will need a CT (I don't have one available at this time).  Strain your urine.  Take care  Dr. Adriana Simas

## 2018-07-23 ENCOUNTER — Telehealth (HOSPITAL_COMMUNITY): Payer: Self-pay | Admitting: Emergency Medicine

## 2018-07-23 NOTE — Telephone Encounter (Signed)
Patient contacted and made aware of all results, all questions answered.   

## 2018-10-29 ENCOUNTER — Ambulatory Visit (INDEPENDENT_AMBULATORY_CARE_PROVIDER_SITE_OTHER): Payer: BC Managed Care – PPO

## 2018-10-29 ENCOUNTER — Encounter: Payer: Self-pay | Admitting: Emergency Medicine

## 2018-10-29 ENCOUNTER — Ambulatory Visit
Admission: EM | Admit: 2018-10-29 | Discharge: 2018-10-29 | Disposition: A | Discharge status: Home / Self Care | Payer: BC Managed Care – PPO | Attending: Family Medicine | Admitting: Family Medicine

## 2018-10-29 ENCOUNTER — Other Ambulatory Visit: Payer: Self-pay

## 2018-10-29 DIAGNOSIS — R030 Elevated blood-pressure reading, without diagnosis of hypertension: Secondary | ICD-10-CM | POA: Diagnosis not present

## 2018-10-29 DIAGNOSIS — M25571 Pain in right ankle and joints of right foot: Secondary | ICD-10-CM

## 2018-10-29 DIAGNOSIS — W19XXXA Unspecified fall, initial encounter: Secondary | ICD-10-CM

## 2018-10-29 NOTE — ED Provider Notes (Signed)
[facility] ____________________________________________  Time seen: Approximately 12:00 PM  I have reviewed the triage vital signs and the nursing notes.   HISTORY  Chief Complaint Ankle Pain (right)  HPI Kenneth Carney is a 58 y.o. male presenting for evaluation of right medial ankle pain post injury that occurred yesterday afternoon.  Patient reports he tripped and twisted his ankle and fell.  States only injured right ankle.  Denies other pain or injuries.  States ankle pain has continued.  Able to weight-bear but does hurt to do so.  Denies alleviating measures.  No pain radiation or paresthesias.  States pain currently mild to moderate.  Reports otherwise doing well.  Denies recent cough, congestion, fevers, chest pain, shortness of breath.  Does have a history of hypertension, states no longer on medication.  Blood pressure was elevated in urgent care, recommend for patient to keep a journal, monitor and follow-up with primary care.   Past Medical History:  Diagnosis Date  . Diabetes mellitus without complication (HCC)   . Hypertension     Patient Active Problem List   Diagnosis Date Noted  . Gastritis due to nonsteroidal anti-inflammatory drug (NSAID) 02/05/2016  . Acute blood loss anemia 02/05/2016  . Upper GI bleed 02/04/2016    Past Surgical History:  Procedure Laterality Date  . ESOPHAGOGASTRODUODENOSCOPY N/A 02/04/2016   Procedure: ESOPHAGOGASTRODUODENOSCOPY (EGD);  Surgeon: Ula Lingoichard Lee, MD;  [facility];  Service: Gastroenterology;  Laterality: N/A;  . lipoma removal     neck and shoulder     No current facility-administered medications for this encounter.  No current outpatient medications on file.  Allergies Patient has no known allergies.  Family History  Problem Relation Age of Onset  . Kidney disease Mother   . Diabetes Mother   . Hypertension Mother   . Cancer Father     Social History Social History   Tobacco Use  .  Smoking status: Former Games developermoker  . Smokeless tobacco: Never Used  Substance Use Topics  . Alcohol use: No  . Drug use: No    Review of Systems Constitutional: No fever Eyes: No visual changes. ENT: No sore throat. Cardiovascular: Denies chest pain. Respiratory: Denies shortness of breath. Gastrointestinal: No abdominal pain.   Musculoskeletal: Positive right ankle pain. Skin: Negative for rash. Neurological: Negative for headaches, focal weakness or numbness.  ____________________________________________   PHYSICAL EXAM:  VITAL SIGNS: ED Triage Vitals  Enc Vitals Group     BP 10/29/18 1019 (!) 160/105     Pulse Rate 10/29/18 1019 67     Resp 10/29/18 1019 16     Temp 10/29/18 1019 98 F (36.7 C)     Temp Source 10/29/18 1019 Oral     SpO2 10/29/18 1019 99 %     Weight 10/29/18 1015 285 lb (129.3 kg)     Height 10/29/18 1015 5\' 8"  (1.727 m)     Head Circumference --      Peak Flow --      Pain Score 10/29/18 1015 9     Pain Loc --      Pain Edu? --      Excl. in GC? --     Constitutional: Alert and oriented. Well appearing and in no acute distress. Eyes: Conjunctivae are normal.  ENT      Head: Normocephalic and atraumatic. Cardiovascular: Normal rate, regular rhythm. Grossly normal heart sounds.  Good peripheral circulation. Respiratory: Normal respiratory effort without tachypnea nor retractions. Breath sounds are clear and  equal bilaterally. No wheezes, rales, rhonchi. Musculoskeletal:  Bilateral pedal pulses equal and easily palpated. Except: Right medial malleolus mild to moderate tenderness to direct palpation with mild localized swelling, no lateral tenderness, right lower extremity otherwise nontender, able to fully plantarflex and dorsiflex, pain in room mildly limited ankle rotation. Neurologic:  Normal speech and language. Skin:  Skin is warm, dry and intact. No rash noted. Psychiatric: Mood and affect are normal. Speech and behavior are normal. Patient  exhibits appropriate insight and judgment   ___________________________________________   LABS (all labs ordered are listed, but only abnormal results are displayed)  Labs Reviewed - No data to display ____________________________________________   RADIOLOGY  Dg Ankle Complete Right  Result Date: 10/29/2018 CLINICAL DATA:  Fall, right ankle pain EXAM: RIGHT ANKLE - COMPLETE 3+ VIEW COMPARISON:  None. FINDINGS: No acute bony abnormality. Specifically, no fracture, subluxation, or dislocation. Plantar and posterior calcaneal spurs. Soft tissues are intact with mild medial soft tissue swelling. IMPRESSION: No acute bony abnormality. Electronically Signed   By: Rolm Baptise M.D.   On: 10/29/2018 10:54   ____________________________________________   PROCEDURES Procedures    INITIAL IMPRESSION / ASSESSMENT AND PLAN / ED COURSE  Pertinent labs & imaging results that were available during my care of the patient were reviewed by me and considered in my medical decision making (see chart for details).  Well-appearing patient.  No acute distress.  Right ankle pain post mechanical injury.  Right x-ray as above, no acute bony abnormality.  Suspect sprain injury.  Velcro ASO splint given for support.  Directed to ice, elevate and gradual increase activity as tolerated.  Work note given for today and tomorrow.  Also encouraged patient to monitor blood pressure at home, keep a journal and follow-up with primary care.  Discussed follow up with Primary care physician this week. Discussed follow up and return parameters including no resolution or any worsening concerns. Patient verbalized understanding and agreed to plan.   ____________________________________________   FINAL CLINICAL IMPRESSION(S) / ED DIAGNOSES  Final diagnoses:  Acute right ankle pain  Elevated blood pressure reading     ED Discharge Orders    None       Note: This dictation was prepared with Dragon dictation along  with smaller phrase technology. Any transcriptional errors that result from this process are unintentional.         Marylene Land, NP 10/29/18 1204

## 2018-10-29 NOTE — Discharge Instructions (Addendum)
Rest. Ice. Gradually increase activity as tolerated. Monitor your blood pressure and follow up with your primary doctor.  Follow up with your primary care physician or the above, this week as needed. Return to Urgent care for new or worsening concerns.

## 2018-10-29 NOTE — ED Triage Notes (Signed)
Patient states that he fell and twisted his right ankle yesterday.  Patient c/o right ankle pain.

## 2018-11-15 IMAGING — CR DG FINGER LITTLE 2+V*R*
3 series · 3 of 3 positions shown · non-contrast
Comparison: None.

CLINICAL DATA: Acute right little finger pain following crush
injury today. Initial encounter.

EXAM:
RIGHT LITTLE FINGER 2+V

[finger ap]
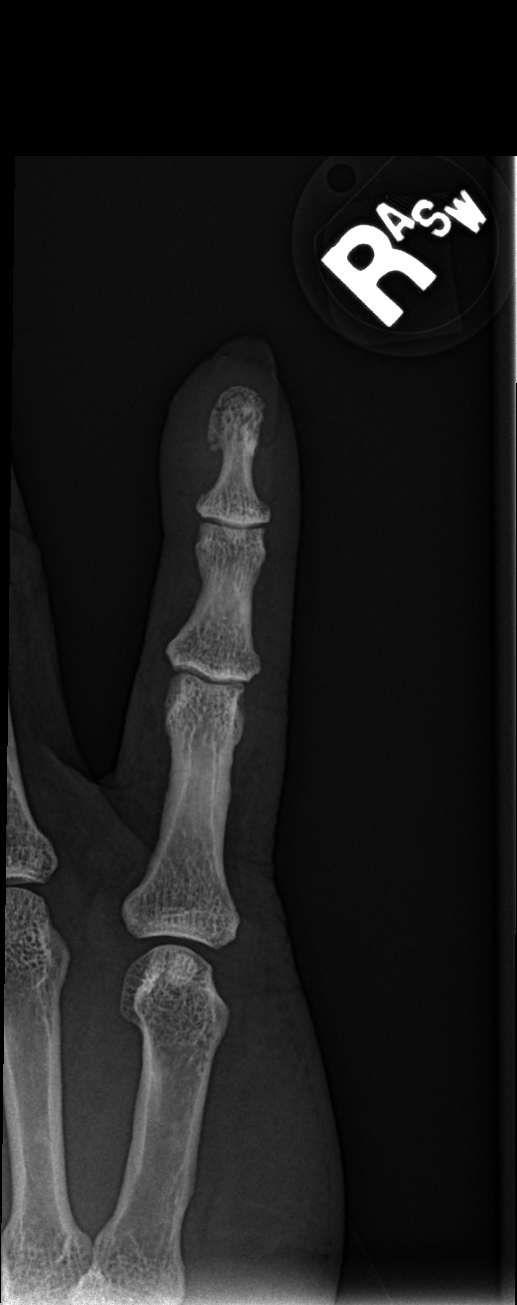

[finger obl]
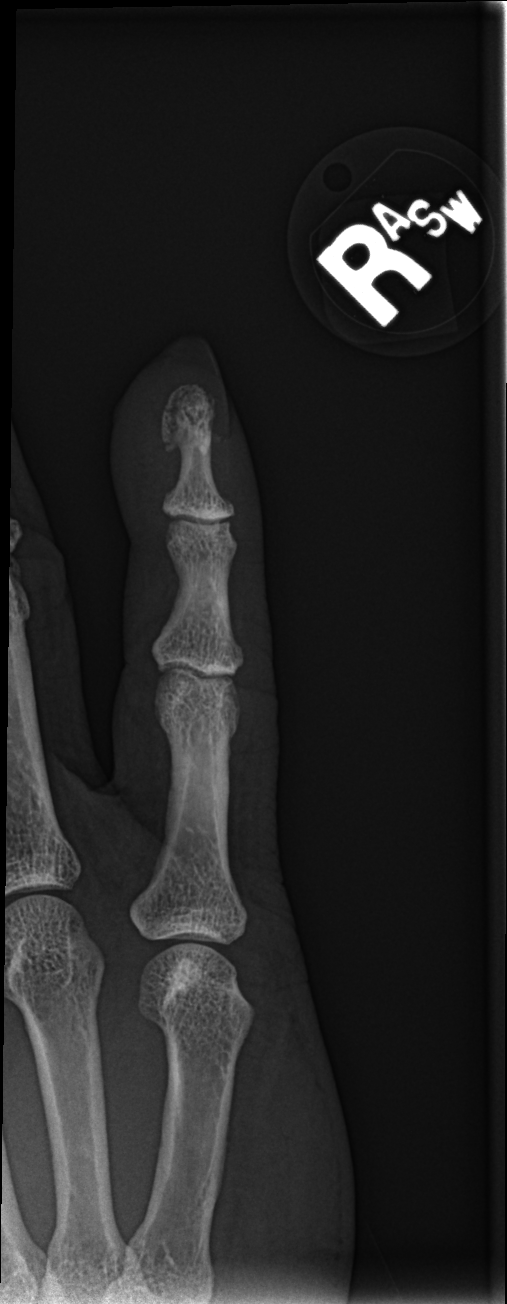

[finger lat]
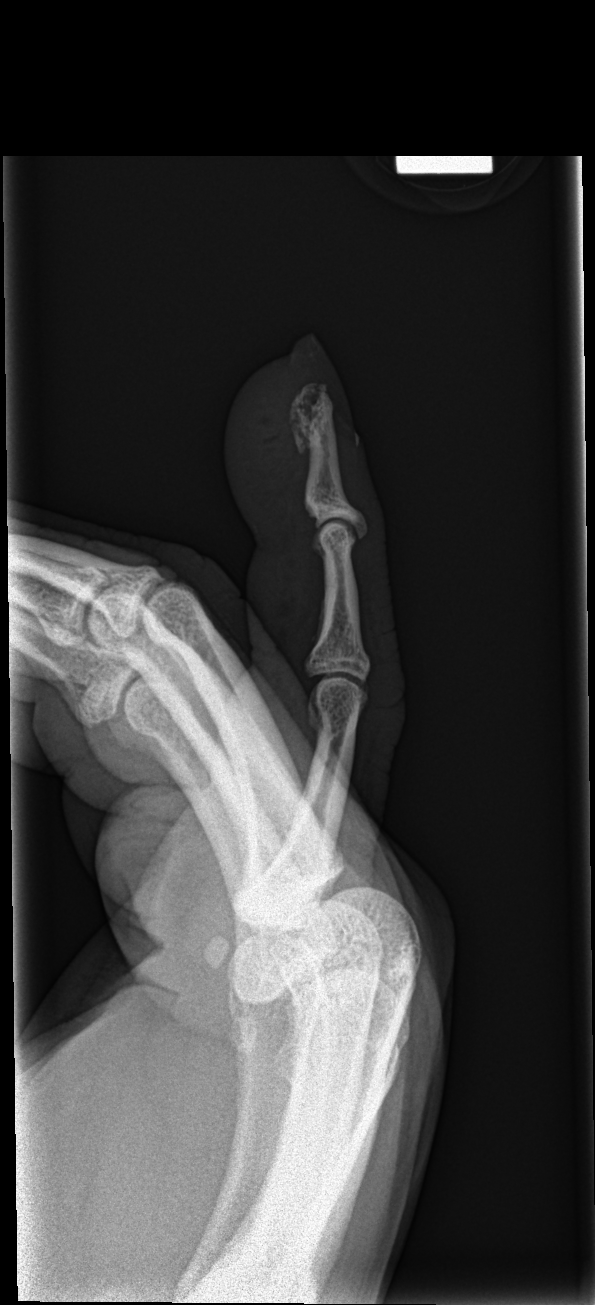

[3 of 3 positions shown; findings below may reference images not displayed]

FINDINGS: A nondisplaced mildly comminuted fracture of the little finger tuft
is identified. Associated soft tissue swelling is present.

There is no evidence of subluxation or dislocation.

Mild degenerative changes at the DIP joint noted.
IMPRESSION: Nondisplaced mildly comminuted tuft fracture.

## 2020-07-12 IMAGING — CR RIGHT ANKLE - COMPLETE 3+ VIEW
3 series · 3 of 3 positions shown · non-contrast
Comparison: None.

CLINICAL DATA: Fall, right ankle pain

EXAM:
RIGHT ANKLE - COMPLETE 3+ VIEW

[ankle ap]
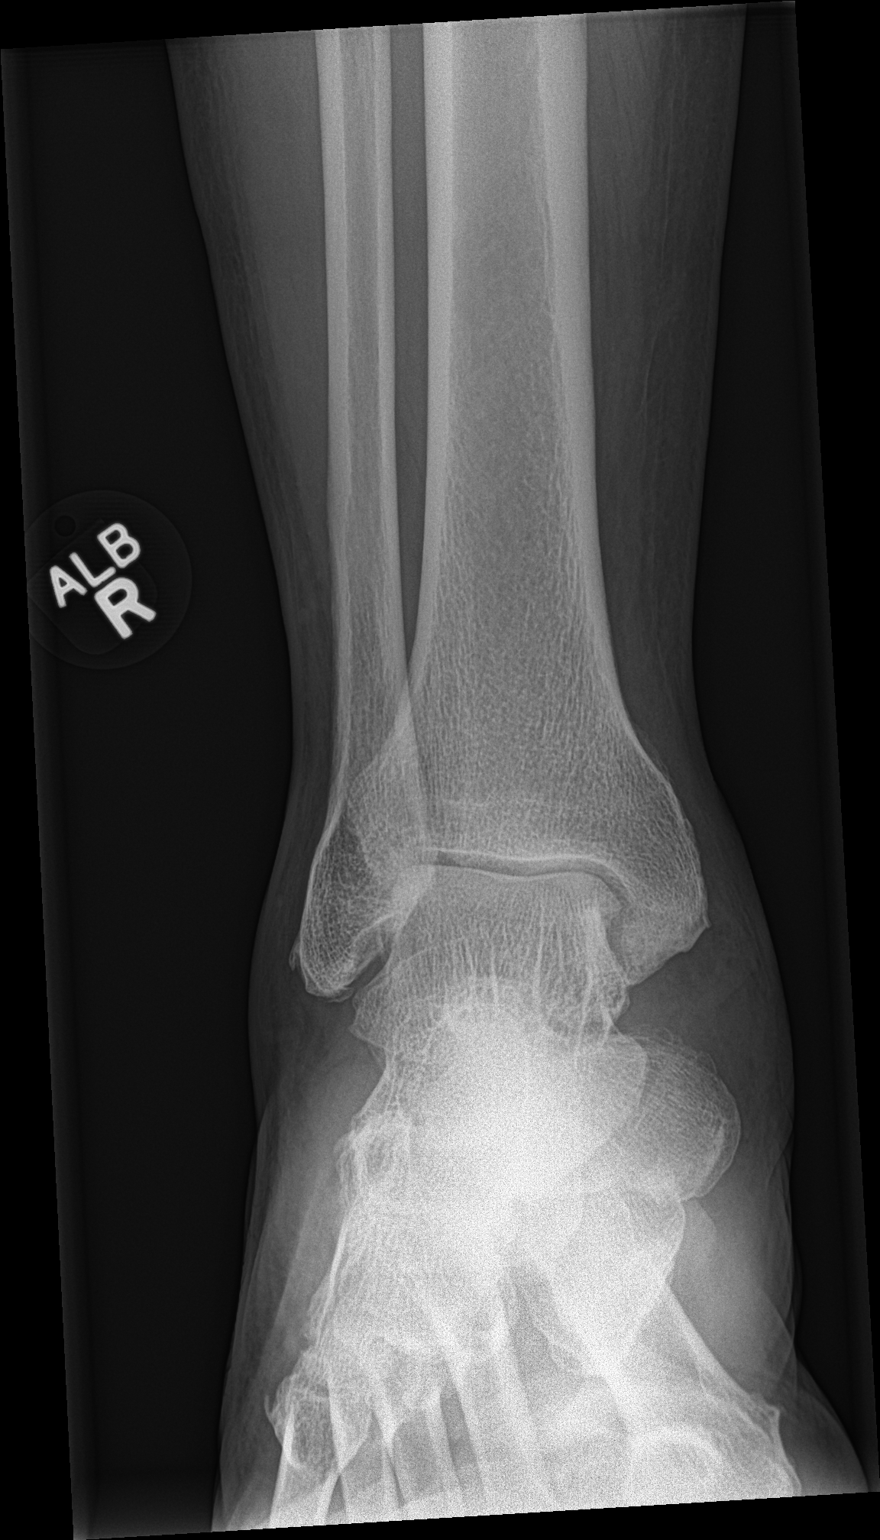

[ankle obl]
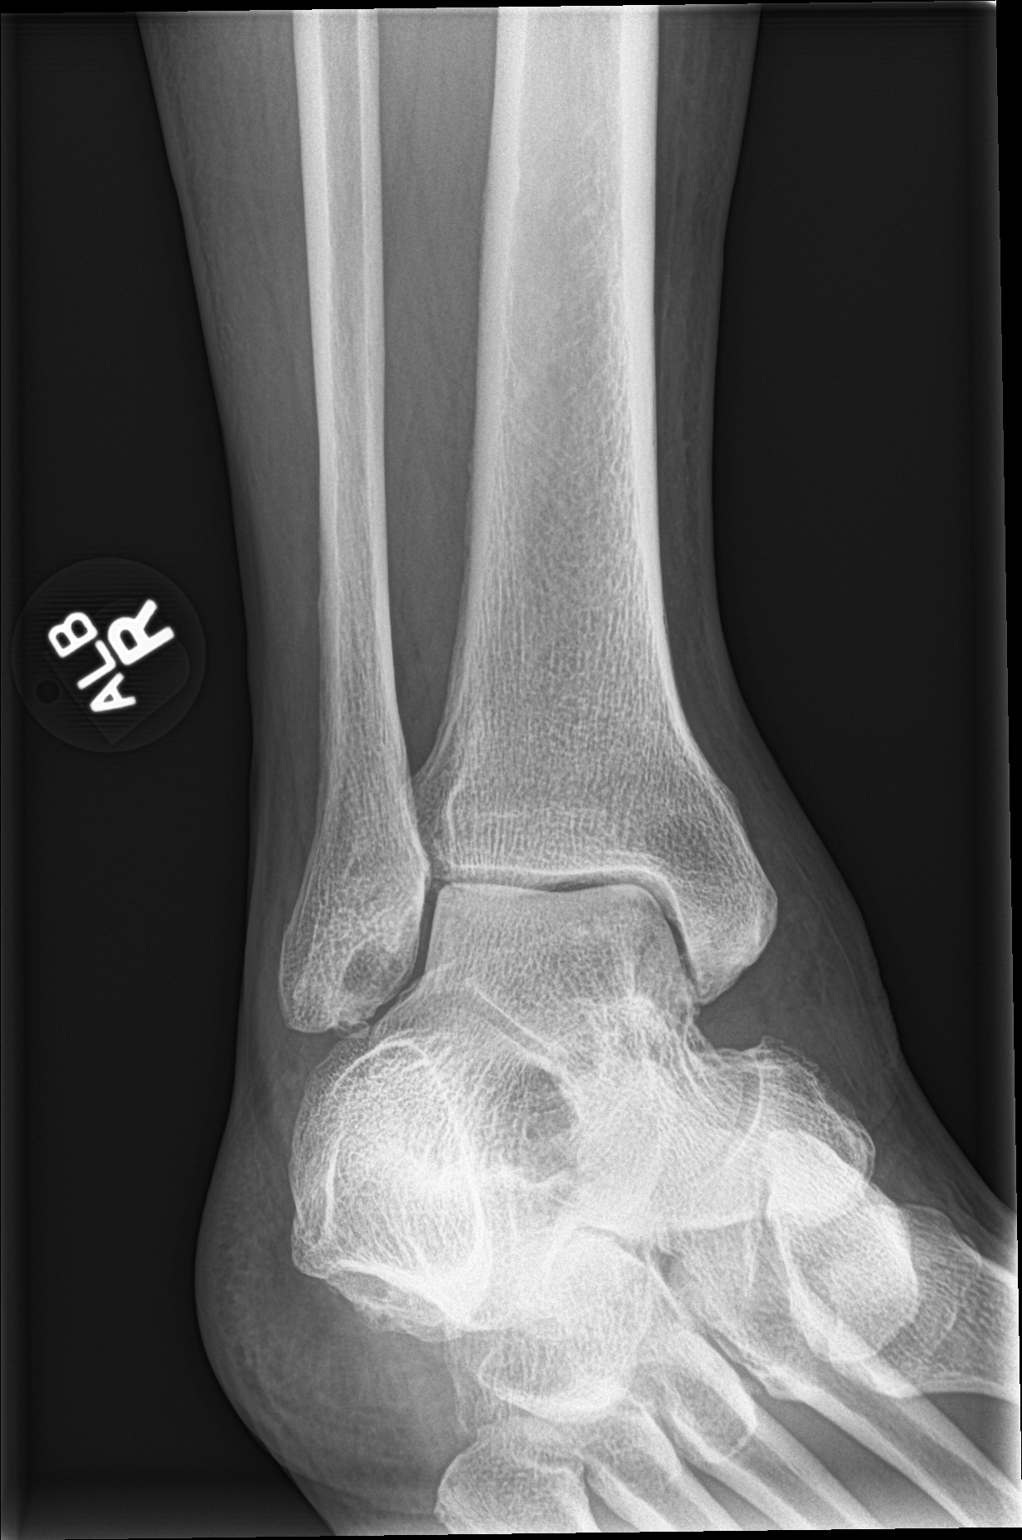

[ankle lat]
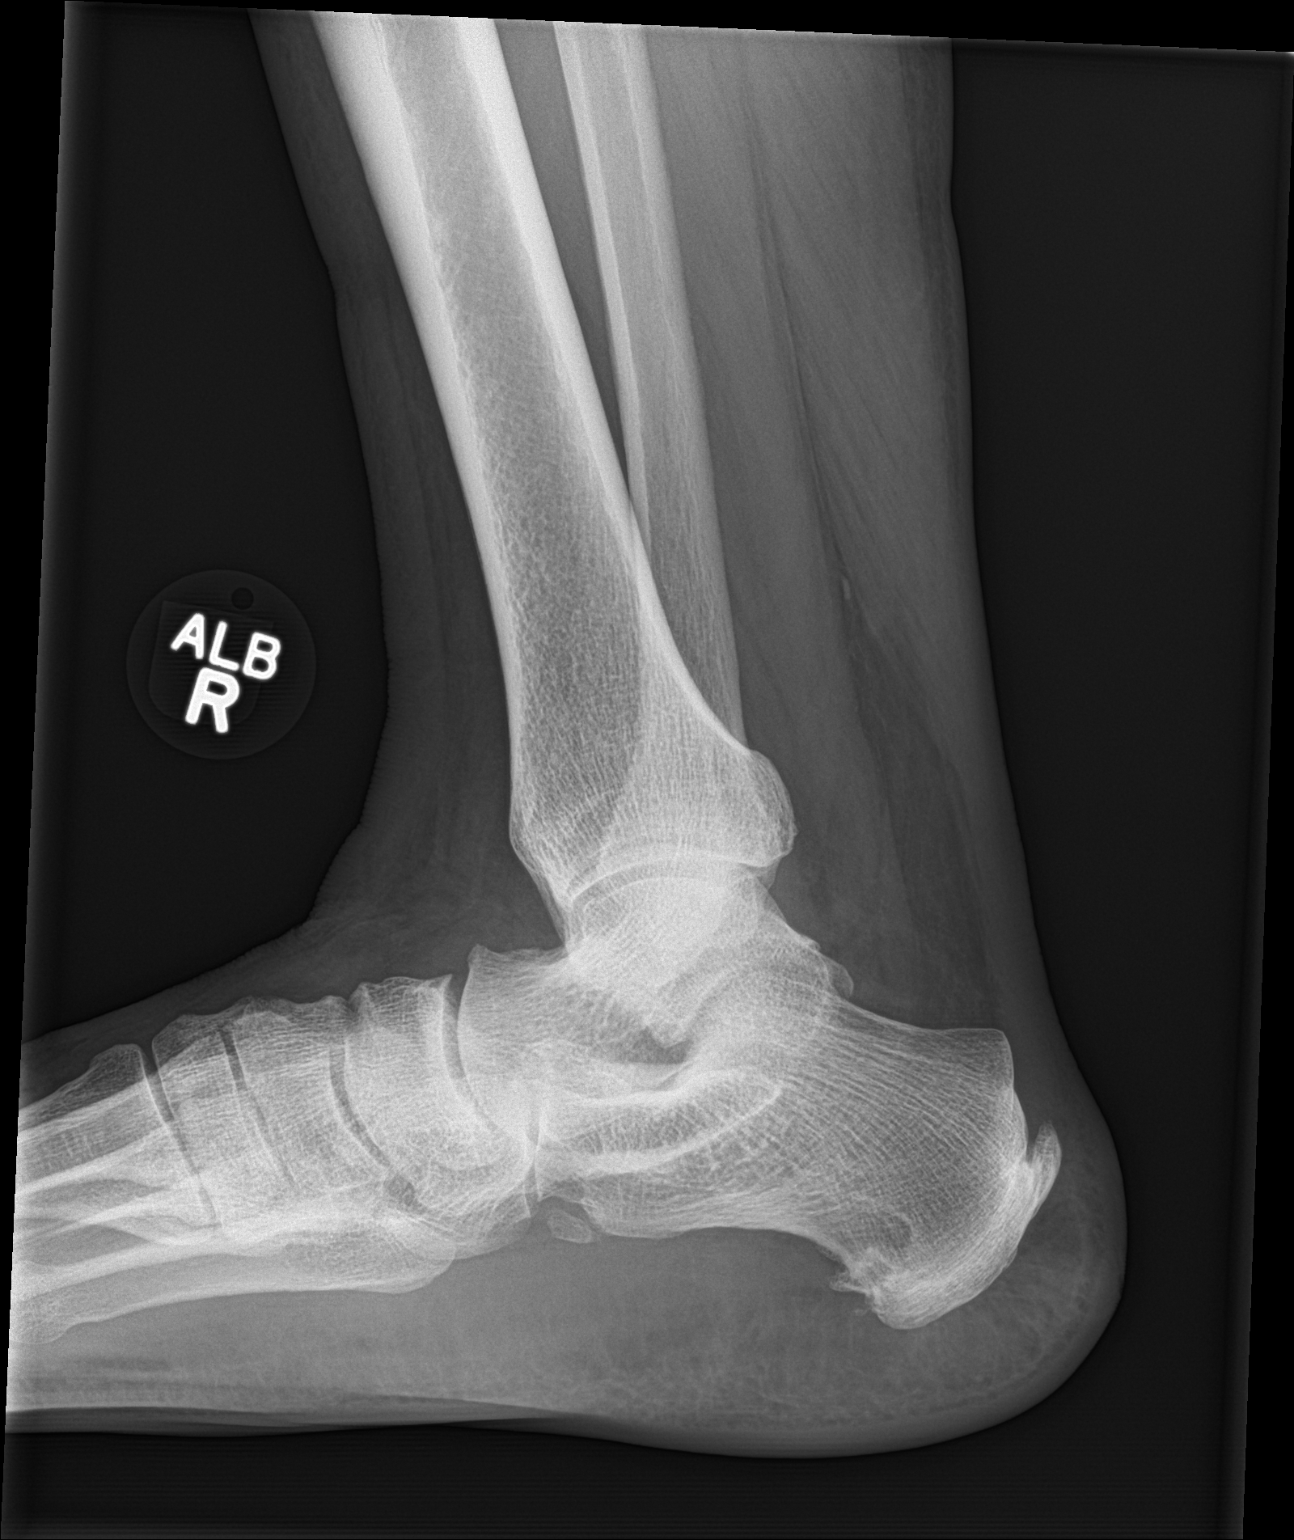

[3 of 3 positions shown; findings below may reference images not displayed]

FINDINGS: No acute bony abnormality. Specifically, no fracture, subluxation,
or dislocation. Plantar and posterior calcaneal spurs. Soft tissues
are intact with mild medial soft tissue swelling.
IMPRESSION: No acute bony abnormality.
